# Patient Record
Sex: Male | Born: 2000 | Race: White | Hispanic: No | Marital: Single | Smoking: Never smoker
Health system: Southern US, Community
[De-identification: ages and names within clinical notes are randomized; demographics above are authoritative.]

## PROBLEM LIST (undated history)

## (undated) DIAGNOSIS — F329 Major depressive disorder, single episode, unspecified: Secondary | ICD-10-CM

## (undated) DIAGNOSIS — F32A Depression, unspecified: Secondary | ICD-10-CM

---

## 2018-02-22 ENCOUNTER — Emergency Department
Admission: EM | Admit: 2018-02-22 | Discharge: 2018-02-22 | Disposition: A | Discharge status: Home / Self Care | Payer: BLUE CROSS/BLUE SHIELD | Attending: Emergency Medicine | Admitting: Emergency Medicine

## 2018-02-22 ENCOUNTER — Encounter: Payer: Self-pay | Admitting: Emergency Medicine

## 2018-02-22 ENCOUNTER — Other Ambulatory Visit: Payer: Self-pay

## 2018-02-22 DIAGNOSIS — S0083XA Contusion of other part of head, initial encounter: Secondary | ICD-10-CM

## 2018-02-22 DIAGNOSIS — W1830XA Fall on same level, unspecified, initial encounter: Secondary | ICD-10-CM | POA: Insufficient documentation

## 2018-02-22 DIAGNOSIS — S0093XA Contusion of unspecified part of head, initial encounter: Secondary | ICD-10-CM | POA: Diagnosis not present

## 2018-02-22 DIAGNOSIS — Y929 Unspecified place or not applicable: Secondary | ICD-10-CM | POA: Insufficient documentation

## 2018-02-22 DIAGNOSIS — Y9361 Activity, american tackle football: Secondary | ICD-10-CM | POA: Insufficient documentation

## 2018-02-22 DIAGNOSIS — Y999 Unspecified external cause status: Secondary | ICD-10-CM | POA: Insufficient documentation

## 2018-02-22 DIAGNOSIS — S0990XA Unspecified injury of head, initial encounter: Secondary | ICD-10-CM | POA: Diagnosis present

## 2018-02-22 HISTORY — DX: Major depressive disorder, single episode, unspecified: F32.9

## 2018-02-22 HISTORY — DX: Depression, unspecified: F32.A

## 2018-02-22 MED ORDER — HYDROXYZINE HCL 10 MG PO TABS: 10.0000 mg | ORAL_TABLET | Freq: Three times a day (TID) | ORAL | 1 refills | Status: AC | PRN

## 2018-02-22 NOTE — ED Provider Notes (Signed)
[facility] Emergency Department Provider Note  ____________________________________________  Time seen: Approximately 11:15 PM  I have reviewed the triage vital signs and the nursing notes.   HISTORY  Chief Complaint Headache    HPI Aaron Tapia is a 17 y.o. male presents to the emergency department with headache that has occurred intermittently for the past 3 days since patient fell and hit his head while playing football outside with his friends.  Patient reports that he did not have loss of consciousness and has experienced no blurry vision.  He denies nausea and vomiting but has had some trouble concentrating.  He admits that he has had increased stress with school of late and has experienced anxiety due to classes.  Patient reports that he just wants to get "checked out".  No prior history of traumatic brain injury.  No alleviating measures of been attempted.   Past Medical History:  Diagnosis Date  . Depression     There are no active problems to display for this patient.   History reviewed. No pertinent surgical history.  Prior to Admission medications   Medication Sig Start Date End Date Taking? Authorizing Provider  hydrOXYzine (ATARAX/VISTARIL) 10 MG tablet Take 1 tablet (10 mg total) by mouth 3 (three) times daily as needed for up to 10 days. 02/22/18 03/04/18  Orvil Feil, PA-C    Allergies Patient has no known allergies.  History reviewed. No pertinent family history.  Social History Social History   Tobacco Use  . Smoking status: Never Smoker  . Smokeless tobacco: Never Used  Substance Use Topics  . Alcohol use: Yes  . Drug use: Yes    Types: Marijuana     Review of Systems  Constitutional: No fever/chills Eyes: No visual changes. No discharge ENT: No upper respiratory complaints. Cardiovascular: no chest pain. Respiratory: no cough. No SOB. Gastrointestinal: No abdominal pain.  No nausea, no vomiting.  No diarrhea.  No  constipation. Genitourinary: Negative for dysuria. No hematuria Musculoskeletal: Negative for musculoskeletal pain. Skin: Negative for rash, abrasions, lacerations, ecchymosis. Neurological: Patient has had intermittent headaches, no focal weakness or numbness.   ____________________________________________   PHYSICAL EXAM:  VITAL SIGNS: ED Triage Vitals  Enc Vitals Group     BP 02/22/18 2043 139/63     Pulse Rate 02/22/18 2043 (!) 58     Resp 02/22/18 2043 16     Temp 02/22/18 2043 98.4 F (36.9 C)     Temp Source 02/22/18 2043 Oral     SpO2 02/22/18 2043 96 %     Weight 02/22/18 2044 171 lb (77.6 kg)     Height 02/22/18 2044 5\' 8"  (1.727 m)     Head Circumference --      Peak Flow --      Pain Score 02/22/18 2043 5     Pain Loc --      Pain Edu? --      Excl. in GC? --      Constitutional: Alert and oriented. Well appearing and in no acute distress. Eyes: Conjunctivae are normal. PERRL. EOMI. Head: Atraumatic. ENT:      Ears: TMs are pearly      Nose: No congestion/rhinnorhea.      Mouth/Throat: Mucous membranes are moist.  Neck: No stridor.  No cervical spine tenderness to palpation. Hematological/Lymphatic/Immunilogical: No cervical lymphadenopathy. Cardiovascular: Normal rate, regular rhythm. Normal S1 and S2.  Good peripheral circulation. Respiratory: Normal respiratory effort without tachypnea or retractions. Lungs CTAB. Good air entry  to the bases with no decreased or absent breath sounds. Gastrointestinal: Bowel sounds 4 quadrants. Soft and nontender to palpation. No guarding or rigidity. No palpable masses. No distention. No CVA tenderness. Musculoskeletal: Full range of motion to all extremities. No gross deformities appreciated. Neurologic:  Normal speech and language. No gross focal neurologic deficits are appreciated.  Skin:  Skin is warm, dry and intact. No rash noted. Psychiatric: Mood and affect are normal. Speech and behavior are normal. Patient  exhibits appropriate insight and judgement.   ____________________________________________   LABS (all labs ordered are listed, but only abnormal results are displayed)  Labs Reviewed - No data to display ____________________________________________  EKG   ____________________________________________  RADIOLOGY   No results found.  ____________________________________________    PROCEDURES  Procedure(s) performed:    Procedures    Medications - No data to display   ____________________________________________   INITIAL IMPRESSION / ASSESSMENT AND PLAN / ED COURSE  Pertinent labs & imaging results that were available during my care of the patient were reviewed by me and considered in my medical decision making (see chart for details).  Review of the [place] CSRS was performed in accordance of the [facility] prior to dispensing any controlled drugs.     Assessment and plan Head contusion Patient presents to the emergency department with intermittent headaches since patient fell and hit his head approximately 3 days ago while playing football outside.  Differential diagnosis included headache versus head contusion versus subdural hematoma.  Patient's neurologic exam was completely reassuring.  The pros and cons of CT head examination were given to the patient and patient has declined CT head at this time.  Patient reported that he would observe his symptoms and return to the emergency department if experiencing any new or worsening symptoms.  Extensive conversation was undertaken regarding patient's anxiety and coping mechanisms.  Patient was discharged with a short course of hydroxyzine to help with anxiety.  Vital signs are reassuring prior to discharge.  All patient questions were answered.    ____________________________________________  FINAL CLINICAL IMPRESSION(S) / ED DIAGNOSES  Final diagnoses:  Contusion of other part of head, initial encounter      NEW  MEDICATIONS STARTED DURING THIS VISIT:  ED Discharge Orders        Ordered    hydrOXYzine (ATARAX/VISTARIL) 10 MG tablet  3 times daily PRN     02/22/18 2212          This chart was dictated using voice recognition software/Dragon. Despite best efforts to proofread, errors can occur which can change the meaning. Any change was purely unintentional.    Orvil FeilWoods, Asra Gambrel M, PA-C 02/22/18 2318    Merrily Brittleifenbark, Neil, MD 02/22/18 2326

## 2018-02-22 NOTE — ED Triage Notes (Signed)
Pt arrived to the ED to be checked to see if he has a concussion. Pt states that 3 days ago he was playing football with his friends and hit the ground very hard, denies LOC. Pt reports that after that he has been experiencing sporadic headaches and having trouble concentrating. Pt reports that he is under a lot of stress and that he is also experiencing seasonal allergies that might be contributing to his symptoms. Pt is AOx4, anxious during triage.

## 2018-03-04 ENCOUNTER — Other Ambulatory Visit: Payer: Self-pay

## 2018-03-04 ENCOUNTER — Encounter: Payer: Self-pay | Admitting: Emergency Medicine

## 2018-03-04 ENCOUNTER — Emergency Department
Admission: EM | Admit: 2018-03-04 | Discharge: 2018-03-04 | Disposition: A | Discharge status: Home / Self Care | Payer: BLUE CROSS/BLUE SHIELD | Attending: Emergency Medicine | Admitting: Emergency Medicine

## 2018-03-04 DIAGNOSIS — R51 Headache: Secondary | ICD-10-CM | POA: Insufficient documentation

## 2018-03-04 DIAGNOSIS — F0781 Postconcussional syndrome: Secondary | ICD-10-CM | POA: Diagnosis not present

## 2018-03-04 NOTE — ED Triage Notes (Signed)
Pt reports that two weeks ago he got a concussion playing football with his friends. He hit his head on the ground states that he gets a headache if he thinks to much and is having trouble focusing. He reports that he was checked. States that he has "been laying low, and trying to go back to school". He reports that he has insomnia then he sleeps a lot, light and noise are still bothering him also.

## 2018-03-04 NOTE — ED Provider Notes (Signed)
[facility] Emergency Department Provider Note  ____________________________________________   I have reviewed the triage vital signs and the nursing notes. Where available I have reviewed prior notes and, if possible and indicated, outside hospital notes.    HISTORY  Chief Complaint Headache    HPI Aaron Tapia is a 17 y.o. male  who is healthy, no history of prior concussions that he knows of, was playing pickup football with his friends 2 weeks ago and suffered a concussion he believes.  He hit his head.  He did not pass out.  He was somnolent however afterwards.  No vomiting.  No focal neurologic deficits he was seen and evaluated 3 days after the injury here, and had a normal neurologic exam, he was hoping to be better by now but he still has some difficulty with focusing and although not as bad as it was bright lights sometimes bother his eyes.  He denies severe headaches he does have low level headaches.  Denies any focal numbness or weakness, he has no stiff neck or other injury, he has not been vomiting has been eating and drinking and sleeping.  He states that he has no numbness or weakness.  No other complaints but he feels somewhat slower since the concussion and he has trouble focusing those are his largest concerns.  Patient also states he is under a lot of stress at school.  He denies SI or HI, he states that this did not happen as a result of an assault.  Does not feel that he is depressed. He did read somewhere that if one has a postconcussive syndrome usually lasts for about a week and he is concerned because his is been going on for 13 days or so.  Past Medical History:  Diagnosis Date  . Depression     There are no active problems to display for this patient.   History reviewed. No pertinent surgical history.  Prior to Admission medications   Medication Sig Start Date End Date Taking? Authorizing Provider  hydrOXYzine (ATARAX/VISTARIL) 10 MG  tablet Take 1 tablet (10 mg total) by mouth 3 (three) times daily as needed for up to 10 days. 02/22/18 03/04/18  Orvil FeilWoods, Jaclyn M, PA-C    Allergies Patient has no known allergies.  History reviewed. No pertinent family history.  Social History Social History   Tobacco Use  . Smoking status: Never Smoker  . Smokeless tobacco: Never Used  Substance Use Topics  . Alcohol use: Yes  . Drug use: Yes    Types: Marijuana    Review of Systems Constitutional: No fever/chills Eyes: No visual changes. ENT: No sore throat. No stiff neck no neck pain Cardiovascular: Denies chest pain. Respiratory: Denies shortness of breath. Gastrointestinal:   no vomiting.  No diarrhea.  No constipation. Genitourinary: Negative for dysuria. Musculoskeletal: Negative lower extremity swelling Skin: Negative for rash. Neurological: Negative for severe headaches, focal weakness or numbness.   ____________________________________________   PHYSICAL EXAM:  VITAL SIGNS: ED Triage Vitals  Enc Vitals Group     BP 03/04/18 1442 (!) 125/57     Pulse Rate 03/04/18 1442 (!) 46     Resp 03/04/18 1442 18     Temp 03/04/18 1442 98 F (36.7 C)     Temp Source 03/04/18 1442 Oral     SpO2 03/04/18 1442 96 %     Weight 03/04/18 1443 171 lb (77.6 kg)     Height 03/04/18 1443 5\' 8"  (1.727 m)  Head Circumference --      Peak Flow --      Pain Score 03/04/18 1443 4     Pain Loc --      Pain Edu? --      Excl. in GC? --     Constitutional: Alert and oriented. Well appearing and in no acute distress. Eyes: Conjunctivae are normal Head: Atraumatic HEENT: No congestion/rhinnorhea. Mucous membranes are moist.  Oropharynx non-erythematous Neck:   Nontender with no meningismus, no masses, no stridor Cardiovascular: Normal rate, regular rhythm. Grossly normal heart sounds.  Good peripheral circulation. Respiratory: Normal respiratory effort.  No retractions. Lungs CTAB. Abdominal: Soft and nontender. No  distention. No guarding no rebound Back:  There is no focal tenderness or step off.  there is no midline tenderness there are no lesions noted. there is no CVA tenderness Musculoskeletal: No lower extremity tenderness, no upper extremity tenderness. No joint effusions, no DVT signs strong distal pulses no edema Neurologic: Cranial nerves II through XII are grossly intact 5 out of 5 strength bilateral upper and lower extremity. Finger to nose within normal limits heel to shin within normal limits, speech is normal with no word finding difficulty or dysarthria, reflexes symmetric, pupils are equally round and reactive to light, there is no pronator drift, sensation is normal, vision is intact to confrontation, gait is deferred, there is no nystagmus, normal neurologic exam Skin:  Skin is warm, dry and intact. No rash noted. Psychiatric: Mood and affect are normal. Speech and behavior are normal.  ____________________________________________   LABS (all labs ordered are listed, but only abnormal results are displayed)  Labs Reviewed - No data to display  Pertinent labs  results that were available during my care of the patient were reviewed by me and considered in my medical decision making (see chart for details). ____________________________________________  EKG  I personally interpreted any EKGs ordered by me or triage  ____________________________________________  RADIOLOGY  Pertinent labs & imaging results that were available during my care of the patient were reviewed by me and considered in my medical decision making (see chart for details). If possible, patient and/or family made aware of any abnormal findings.  No results found. ____________________________________________    PROCEDURES  Procedure(s) performed: None  Procedures  Critical Care performed: None  ____________________________________________   INITIAL IMPRESSION / ASSESSMENT AND PLAN / ED  COURSE  Pertinent labs & imaging results that were available during my care of the patient were reviewed by me and considered in my medical decision making (see chart for details).  Since neurovascularly intact no evidence pathology that would require neurosurgical intervention nor is there any indication for imaging therefore.  Certainly not in the emergency department with a CT scan.  Patient will require however further neurocognitive testing probably after this concussion.  Giving him a note for school, and I have sent a referral to the concussion syndrome clinic at [facility] which I think will help him.  Patient does not drive he said no seizure activity, he is very well-appearing he is is having some focusing issues after concussion I did extensively give him counseling about all of the associated symptoms and things to look out for and have strongly advised him not to participate in any behaviors that could cause him to have another head injury.  Patient at this time does not seem to be significantly depressed although he states he is stressed, he does not meet criteria for inpatient psychiatric care obviously.  ____________________________________________   FINAL CLINICAL IMPRESSION(S) / ED DIAGNOSES  Final diagnoses:  Post concussion syndrome      This chart was dictated using voice recognition software.  Despite best efforts to proofread,  errors can occur which can change meaning.      Jeanmarie Plant, MD 03/04/18 1539

## 2018-08-12 ENCOUNTER — Ambulatory Visit
Admission: RE | Admit: 2018-08-12 | Discharge: 2018-08-12 | Disposition: A | Discharge status: Home / Self Care | Payer: BLUE CROSS/BLUE SHIELD | Source: Ambulatory Visit | Attending: Medical | Admitting: Medical

## 2018-08-12 ENCOUNTER — Other Ambulatory Visit: Payer: Self-pay | Admitting: Family Medicine

## 2018-08-12 ENCOUNTER — Ambulatory Visit
Admission: RE | Admit: 2018-08-12 | Discharge: 2018-08-12 | Disposition: A | Discharge status: Home / Self Care | Payer: BLUE CROSS/BLUE SHIELD | Source: Ambulatory Visit | Attending: Family Medicine | Admitting: Family Medicine

## 2018-08-12 DIAGNOSIS — S99911A Unspecified injury of right ankle, initial encounter: Secondary | ICD-10-CM | POA: Insufficient documentation

## 2018-08-12 DIAGNOSIS — X58XXXA Exposure to other specified factors, initial encounter: Secondary | ICD-10-CM | POA: Insufficient documentation

## 2019-09-07 ENCOUNTER — Other Ambulatory Visit: Payer: Self-pay | Admitting: *Deleted

## 2019-09-07 DIAGNOSIS — Z20822 Contact with and (suspected) exposure to covid-19: Secondary | ICD-10-CM

## 2019-09-08 LAB — NOVEL CORONAVIRUS, NAA: SARS-CoV-2, NAA: NOT DETECTED

## 2019-09-09 IMAGING — CR DG ANKLE COMPLETE 3+V*R*
3 series · 3 of 3 positions shown · non-contrast
Comparison: None.

CLINICAL DATA: Soccer injury.  Right ankle pain.

EXAM:
RIGHT ANKLE - COMPLETE 3+ VIEW

[ankle ap]
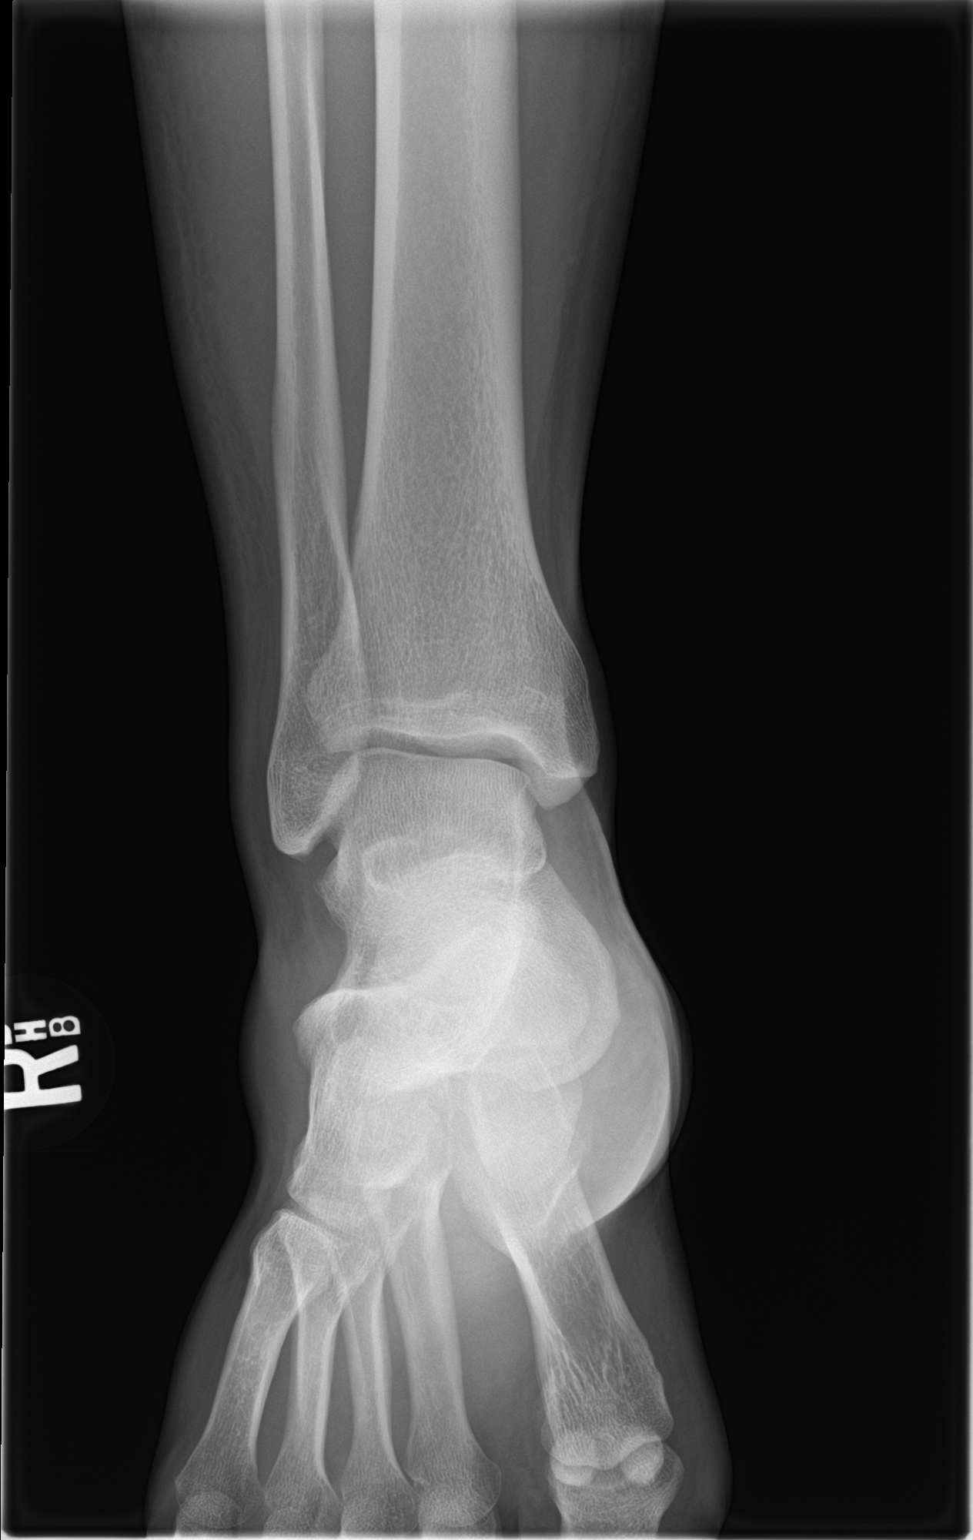

[ankle obl]
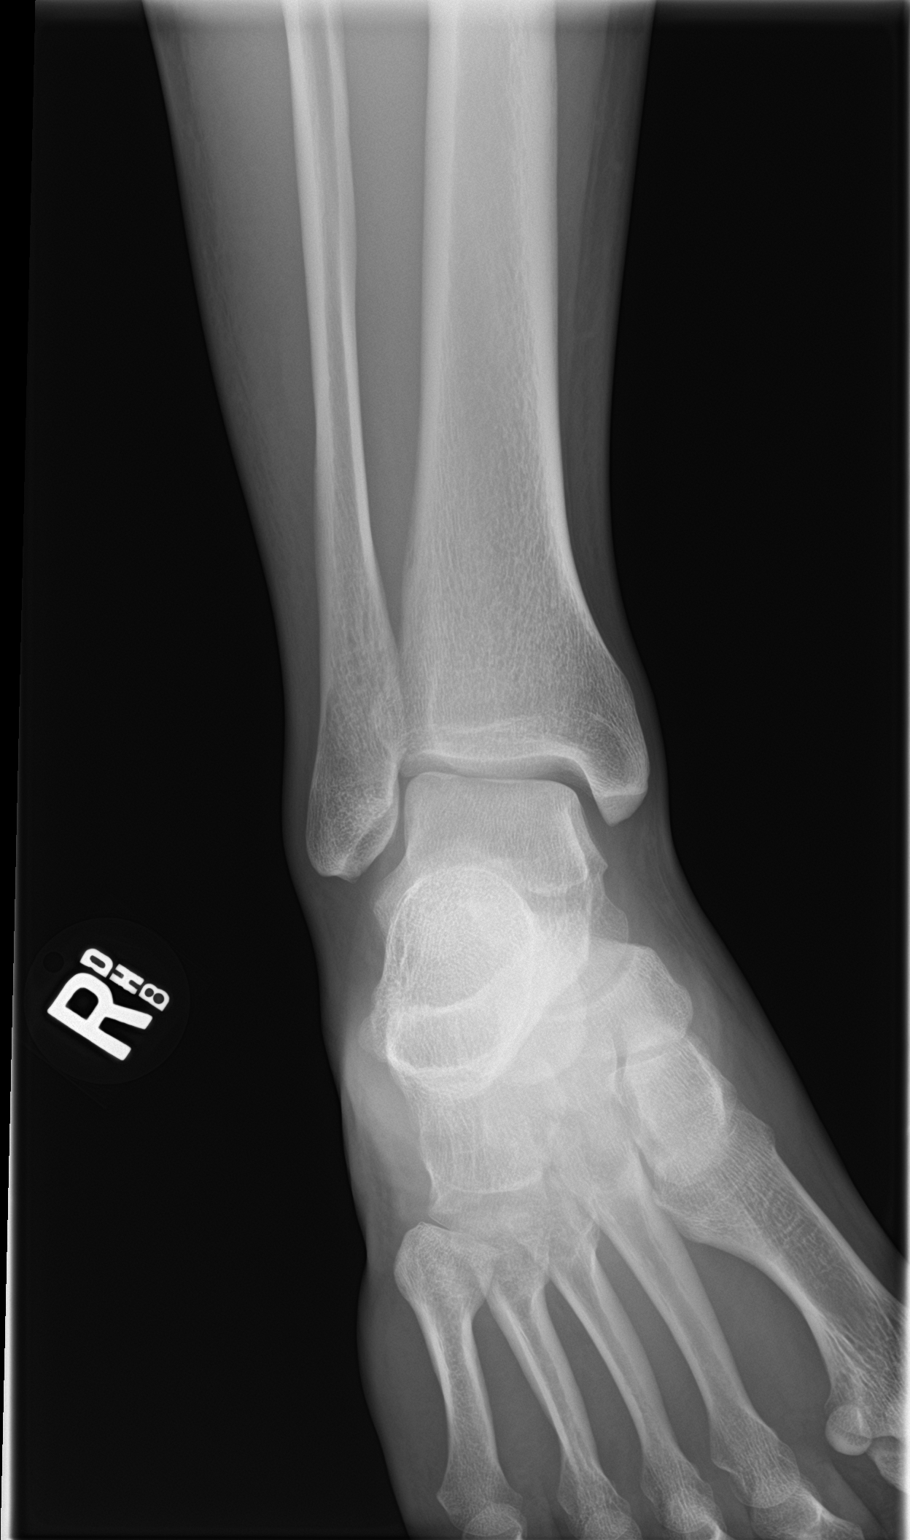

[ankle lat]
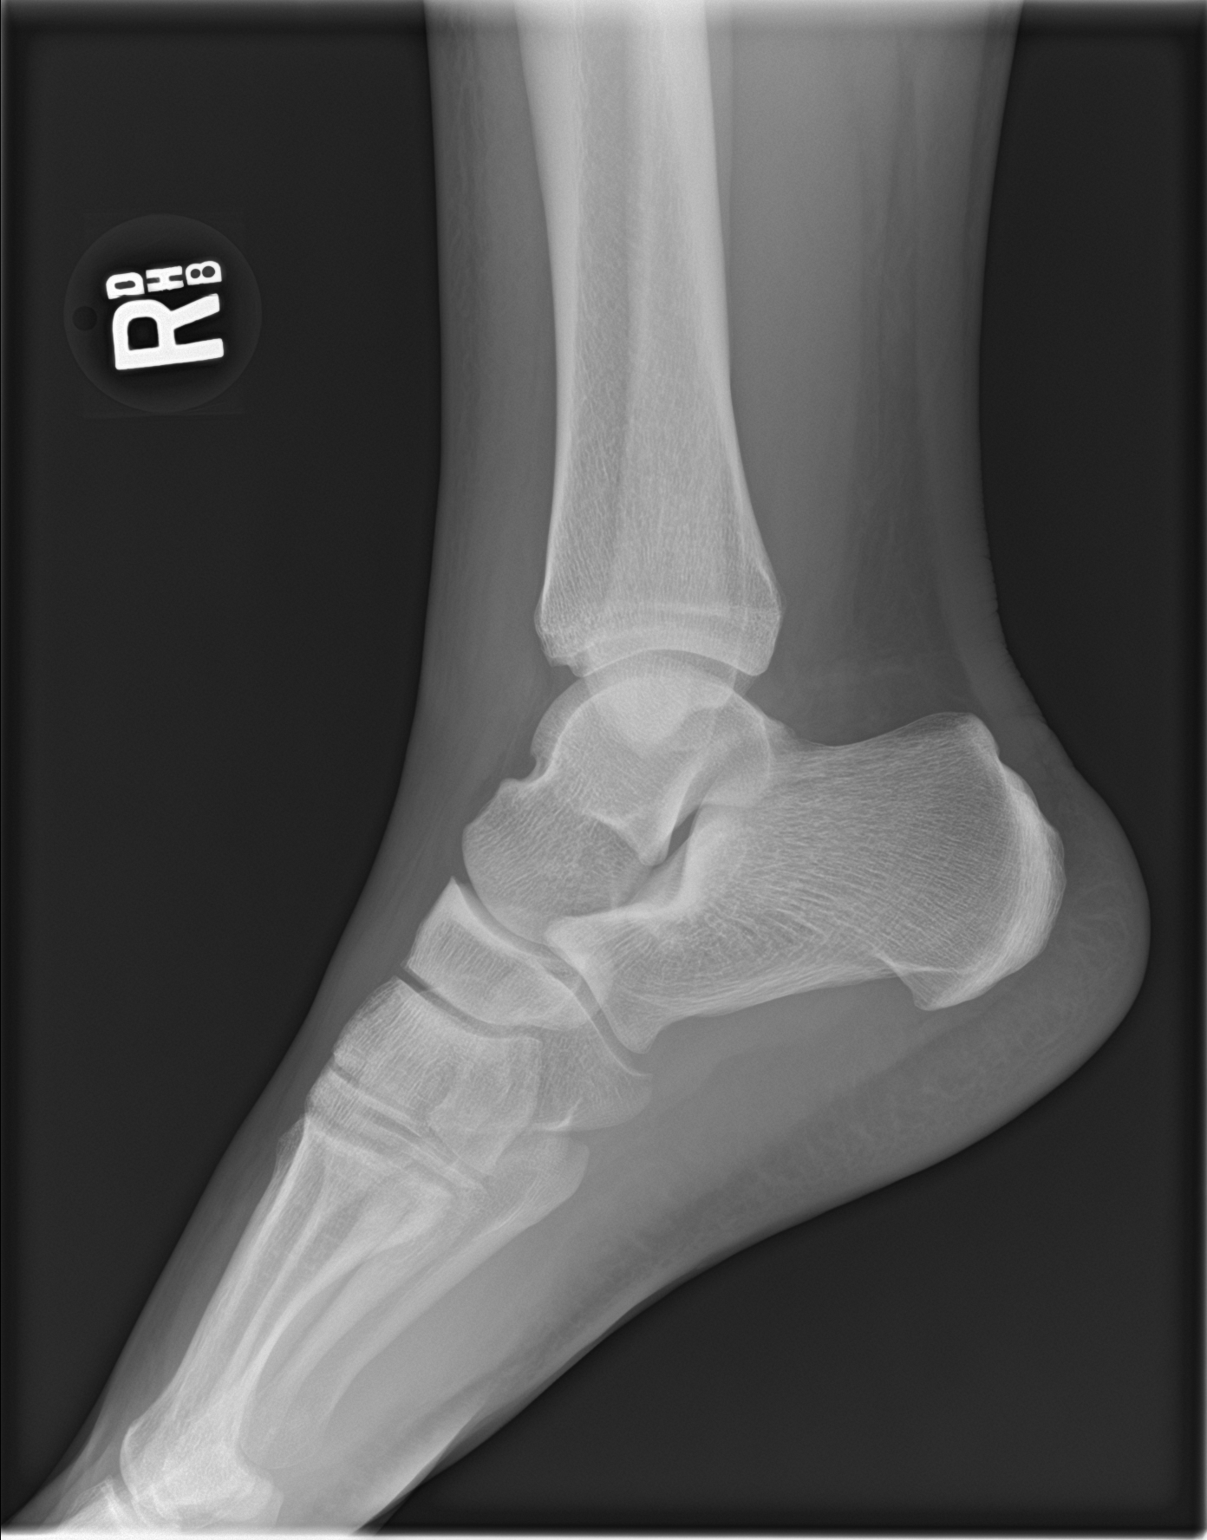

[3 of 3 positions shown; findings below may reference images not displayed]

FINDINGS: There is no evidence of fracture, dislocation, or joint effusion.
There is no evidence of arthropathy or other focal bone abnormality.
Soft tissues are unremarkable.
IMPRESSION: Negative.
